# Patient Record
Sex: Female | Born: 1970 | Race: White | Hispanic: No | Marital: Married | Smoking: Never smoker
Health system: Southern US, Community
[De-identification: ages and names within clinical notes are randomized; demographics above are authoritative.]

## PROBLEM LIST (undated history)

## (undated) DIAGNOSIS — F32A Depression, unspecified: Secondary | ICD-10-CM

## (undated) DIAGNOSIS — F419 Anxiety disorder, unspecified: Secondary | ICD-10-CM

## (undated) DIAGNOSIS — Z789 Other specified health status: Secondary | ICD-10-CM

## (undated) DIAGNOSIS — D649 Anemia, unspecified: Secondary | ICD-10-CM

## (undated) HISTORY — DX: Other specified health status: Z78.9

## (undated) HISTORY — PX: BREAST ENHANCEMENT SURGERY: SHX7

## (undated) HISTORY — PX: AUGMENTATION MAMMAPLASTY: SUR837

## (undated) HISTORY — PX: COLONOSCOPY: SHX174

---

## 2020-02-15 ENCOUNTER — Emergency Department (HOSPITAL_BASED_OUTPATIENT_CLINIC_OR_DEPARTMENT_OTHER)
Admission: EM | Admit: 2020-02-15 | Discharge: 2020-02-15 | Disposition: A | Discharge status: Home / Self Care | Payer: 59 | Attending: Emergency Medicine | Admitting: Emergency Medicine

## 2020-02-15 ENCOUNTER — Other Ambulatory Visit: Payer: Self-pay

## 2020-02-15 ENCOUNTER — Emergency Department (HOSPITAL_BASED_OUTPATIENT_CLINIC_OR_DEPARTMENT_OTHER): Payer: 59

## 2020-02-15 ENCOUNTER — Encounter (HOSPITAL_BASED_OUTPATIENT_CLINIC_OR_DEPARTMENT_OTHER): Payer: Self-pay

## 2020-02-15 DIAGNOSIS — S3992XA Unspecified injury of lower back, initial encounter: Secondary | ICD-10-CM | POA: Diagnosis present

## 2020-02-15 DIAGNOSIS — Y9389 Activity, other specified: Secondary | ICD-10-CM | POA: Diagnosis not present

## 2020-02-15 DIAGNOSIS — S32008A Other fracture of unspecified lumbar vertebra, initial encounter for closed fracture: Secondary | ICD-10-CM | POA: Diagnosis not present

## 2020-02-15 DIAGNOSIS — Y92410 Unspecified street and highway as the place of occurrence of the external cause: Secondary | ICD-10-CM | POA: Insufficient documentation

## 2020-02-15 DIAGNOSIS — Y999 Unspecified external cause status: Secondary | ICD-10-CM | POA: Insufficient documentation

## 2020-02-15 DIAGNOSIS — S7001XA Contusion of right hip, initial encounter: Secondary | ICD-10-CM | POA: Diagnosis not present

## 2020-02-15 DIAGNOSIS — S32009A Unspecified fracture of unspecified lumbar vertebra, initial encounter for closed fracture: Secondary | ICD-10-CM

## 2020-02-15 MED ORDER — HYDROCODONE-ACETAMINOPHEN 5-325 MG PO TABS: 1.0000 | ORAL_TABLET | Freq: Four times a day (QID) | ORAL | 0 refills | Status: DC | PRN
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
## 2020-02-15 NOTE — Discharge Instructions (Addendum)
Take ibuprofen 600 mg every 6 hours as needed for pain.  Begin taking hydrocodone as prescribed as needed for pain not relieved with ibuprofen.  Rest and no strenuous activity for the next month.  Follow-up with your primary doctor if not improving in the next 2 weeks.  Return to the ER if symptoms significantly worsen or change.
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
## 2020-02-24 ENCOUNTER — Other Ambulatory Visit: Payer: Self-pay | Admitting: Orthopedic Surgery

## 2020-02-24 DIAGNOSIS — M259 Joint disorder, unspecified: Secondary | ICD-10-CM

## 2020-03-10 ENCOUNTER — Other Ambulatory Visit: Payer: Self-pay

## 2020-03-10 ENCOUNTER — Ambulatory Visit
Admission: RE | Admit: 2020-03-10 | Discharge: 2020-03-10 | Disposition: A | Discharge status: Home / Self Care | Payer: 59 | Source: Ambulatory Visit | Attending: Orthopedic Surgery | Admitting: Orthopedic Surgery

## 2020-03-10 DIAGNOSIS — M259 Joint disorder, unspecified: Secondary | ICD-10-CM

## 2020-03-18 ENCOUNTER — Other Ambulatory Visit: Payer: Self-pay

## 2020-03-18 ENCOUNTER — Ambulatory Visit (INDEPENDENT_AMBULATORY_CARE_PROVIDER_SITE_OTHER): Payer: 59 | Admitting: Neurology

## 2020-03-18 ENCOUNTER — Encounter: Payer: Self-pay | Admitting: Neurology

## 2020-03-18 VITALS — BP 110/71 | HR 67 | Ht 63.0 in | Wt 138.0 lb

## 2020-03-18 DIAGNOSIS — R42 Dizziness and giddiness: Secondary | ICD-10-CM

## 2020-03-18 DIAGNOSIS — F0781 Postconcussional syndrome: Secondary | ICD-10-CM | POA: Diagnosis not present

## 2020-03-18 MED ORDER — AMITRIPTYLINE HCL 10 MG PO TABS: 10.0000 mg | ORAL_TABLET | Freq: Every day | ORAL | 2 refills | Status: DC
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
## 2020-03-18 NOTE — Patient Instructions (Signed)
Amitriptyline at bedtime   Returning to Sports and Activities After a Concussion, Adult Knowing when to return to sports and activities after a concussion is important. Make sure you wait to return to activity until after both of these have occurred:  Your symptoms are completely gone.  A health care provider says it is safe. Returning to activity too soon increases the risk of serious and long-lasting symptoms. You may also need to take time away from work or other activities that require concentration, depending on how severe your concussion is. Concussions can have serious effects on your brain. People who have more than one concussion are at greater risk of having long-term (chronic) headaches and problems with learning. When can I return to sports or other activities? You should stop participating in an activity right away after you hit your head or a concussion is suspected. You need to rest physically and mentally. You should also be monitored carefully by another adult. How quickly you can return to sports and other activities depends on:  Your age.  The severity of your concussion.  Your health before the injury.  Whether you have had a previous concussion. How should I gradually return to sports or other activities?  You should not return to sports or activities until you are symptom-free without medicine for at least 24 hours. Your health care provider will determine when your symptoms are completely gone and when it is safe for you to practice and play sports again. Make sure you return to sports gradually. Do not try to do too much too soon. Gradually advance through the following activity levels to return to sports: 1. Begin with only light aerobic activity to increase your heart rate. You may bike, walk, or jog for up to 10 minutes. Do not jump, run, or lift weights. 2. Get moderate physical activity with some head and body movements. Running short distances, fast jogging,  using a stationary bike, and moderate-intensity weight lifting are okay. 3. Participate in high-intensity exercise without physical contact. 4. Return to your normal practice routine, which may include full contact. 5. Return to play in games, matches, or other competitions. Some people can progress quickly through these levels. Other people will need several days to go from one level to the next. Do not move on to the next level until you have been symptom-free for at least 24 hours after doing activity at the previous level. Symptoms to watch for include:  Tiredness (fatigue).  Headache.  Problems with balance, coordination, or memory. If you notice any of these warning signs during exercise or other physical activities, rest for at least 24 hours or until the symptoms go away. You can then return to activity. Start at the activity level that you were on before your symptoms began. What symptoms are important to report to my health care provider? Concussion symptoms may not appear right away. They could also get worse at any time. It is important to let your health care provider know if you have any new or worsening symptoms. Watch for these symptoms: Physical symptoms  Headaches.  Dizziness and problems with coordination or balance.  Sensitivity to light or noise.  Nausea or vomiting.  Tiredness (fatigue).  Vision or hearing problems.  Seizure. Mental and emotional symptoms  Irritability or mood changes.  Memory problems.  Trouble concentrating, organizing, or making decisions.  Changes in eating or sleeping patterns.  Slowness in thinking, acting or reacting, speaking, or reading.  Anxiety or depression. Certain health  issues may make your recovery from a concussion take longer. Let your health care provider know if you have a history of:  Migraines.  Depression.  Mood disorders.  Anxiety.  A developmental disorder, such as ADHD.  Any previous  concussions. What are some questions to ask my health care provider? When you have a concussion, learning as much as you can about your injury can help you protect your long-term health. Ask your health care provider the following questions: Questions about recovery and treatment  Is it safe for me to return to sports or other physical activities?  Should I limit how much time I watch TV, play video games, or use a computer?  Do I need to take time away from work?  Do I need more sleep than normal? Questions about the effects of a concussion  What are the short-term and long-term consequences of my injury?  How might the concussion affect my professional life?  Could I have problems with memory or learning? Questions about getting another concussion  When should I go to the emergency room?  What happens if I get another concussion?  What are the warning signs of another concussion?  Could I have a concussion without knowing it?  How can I prevent another concussion from happening?  Should I consider not playing sports anymore? Where to find more information  Centers for Disease Control and Prevention: [employer]Ex[employer].com.brwww.cdc.gov Summary  Returning to activity too soon after a concussion increases the risk of serious and long-lasting symptoms.  After a concussion, you must rest physically and mentally until your symptoms are gone. Your health care provider will determine when it is safe for you to return to sports, work, and other activities that require concentration.  Returning to activity is a slow process, starting with limited activity and monitoring for symptoms.  If symptoms do not occur with activity, the intensity of activity can be increased.  Make sure you talk with your health care provider before returning to activity. This information is not intended to replace advice given to you by your health care provider. Make sure you discuss any questions you have with your health  care provider. Document Revised: 05/15/2019 Document Reviewed: 05/15/2019 Elsevier Patient Education  2020 Elsevier Inc.  Post-Concussion Syndrome  Post-concussion syndrome is when symptoms last longer than normal after a head injury. What are the signs or symptoms? After a head injury, you may:  Have headaches.  Feel tired.  Feel dizzy.  Feel weak.  Have trouble seeing.  Have trouble in bright lights.  Have trouble hearing.  Not be able to remember things.  Not be able to focus.  Have trouble sleeping.  Have mood swings.  Have trouble learning new things. These can last from weeks to months. Follow these instructions at home: Medicines  Take all medicines only as told by your doctor.  Do not take prescription pain medicines. Activity  Limit activities as told by your doctor. This includes: ? Homework. ? Job-related work. ? Thinking. ? Watching TV. ? Using a computer or phone. ? Puzzles. ? Exercise. ? Sports.  Slowly return to your normal activity as told by your doctor.  Stop an activity if you have symptoms.  Do not do anything that may cause you to get injured again. General instructions  Rest. Try to: ? Sleep 7-9 hours each night. ? Take naps or breaks when you feel tired during the day.  Do not drink alcohol until your doctor says that you can.  Keep track of your symptoms.  Keep all follow-up visits as told by your doctor. This is important. Contact a doctor if:  You do not improve.  You get worse.  You have another injury. Get help right away if:  You have a very bad headache.  You feel confused.  You feel very sleepy.  You pass out (faint).  You throw up (vomit).  You feel weak in any part of your body.  You feel numb in any part of your body.  You start shaking (have a seizure).  You have trouble talking. Summary  Post-concussion syndrome is when symptoms last longer than normal after a head injury.  Limit all  activity after your injury. Gradually return to normal activity as told by your doctor.  Rest, do not drink alcohol, and avoid prescription pain medicines after a concussion.  Call your doctor if your symptoms get worse. This information is not intended to replace advice given to you by your health care provider. Make sure you discuss any questions you have with your health care provider. Document Revised: 07/26/2018 Document Reviewed: 11/07/2017 Elsevier Patient Education  2020 Elsevier Inc.  Amitriptyline tablets What is this medicine? AMITRIPTYLINE (a mee TRIP ti leen) is used to treat depression. This medicine may be used for other purposes; ask your health care provider or pharmacist if you have questions. COMMON BRAND NAME(S): Elavil, Vanatrip What should I tell my health care provider before I take this medicine? They need to know if you have any of these conditions:  an alcohol problem  asthma, difficulty breathing  bipolar disorder or schizophrenia  difficulty passing urine, prostate trouble  glaucoma  heart disease or previous heart attack  liver disease  over active thyroid  seizures  thoughts or plans of suicide, a previous suicide attempt, or family history of suicide attempt  an unusual or allergic reaction to amitriptyline, other medicines, foods, dyes, or preservatives  pregnant or trying to get pregnant  breast-feeding How should I use this medicine? Take this medicine by mouth with a drink of water. Follow the directions on the prescription label. You can take the tablets with or without food. Take your medicine at regular intervals. Do not take it more often than directed. Do not stop taking this medicine suddenly except upon the advice of your doctor. Stopping this medicine too quickly may cause serious side effects or your condition may worsen. A special MedGuide will be given to you by the pharmacist with each prescription and refill. Be sure to read  this information carefully each time. Talk to your pediatrician regarding the use of this medicine in children. Special care may be needed. Overdosage: If you think you have taken too much of this medicine contact a poison control center or emergency room at once. NOTE: This medicine is only for you. Do not share this medicine with others. What if I miss a dose? If you miss a dose, take it as soon as you can. If it is almost time for your next dose, take only that dose. Do not take double or extra doses. What may interact with this medicine? Do not take this medicine with any of the following medications:  arsenic trioxide  certain medicines used to regulate abnormal heartbeat or to treat other heart conditions  cisapride  droperidol  halofantrine  linezolid  MAOIs like Carbex, Eldepryl, Marplan, Nardil, and Parnate  methylene blue  other medicines for mental depression  phenothiazines like perphenazine, thioridazine and chlorpromazine  pimozide  probucol  procarbazine  sparfloxacin  St. John's Wort This medicine may also interact with the following medications:  atropine and related drugs like hyoscyamine, scopolamine, tolterodine and others  barbiturate medicines for inducing sleep or treating seizures, like phenobarbital  cimetidine  disulfiram  ethchlorvynol  thyroid hormones such as levothyroxine  ziprasidone This list may not describe all possible interactions. Give your health care provider a list of all the medicines, herbs, non-prescription drugs, or dietary supplements you use. Also tell them if you smoke, drink alcohol, or use illegal drugs. Some items may interact with your medicine. What should I watch for while using this medicine? Tell your doctor if your symptoms do not get better or if they get worse. Visit your doctor or health care professional for regular checks on your progress. Because it may take several weeks to see the full effects of  this medicine, it is important to continue your treatment as prescribed by your doctor. Patients and their families should watch out for new or worsening thoughts of suicide or depression. Also watch out for sudden changes in feelings such as feeling anxious, agitated, panicky, irritable, hostile, aggressive, impulsive, severely restless, overly excited and hyperactive, or not being able to sleep. If this happens, especially at the beginning of treatment or after a change in dose, call your health care professional. Bonita Quin may get drowsy or dizzy. Do not drive, use machinery, or do anything that needs mental alertness until you know how this medicine affects you. Do not stand or sit up quickly, especially if you are an older patient. This reduces the risk of dizzy or fainting spells. Alcohol may interfere with the effect of this medicine. Avoid alcoholic drinks. Do not treat yourself for coughs, colds, or allergies without asking your doctor or health care professional for advice. Some ingredients can increase possible side effects. Your mouth may get dry. Chewing sugarless gum or sucking hard candy, and drinking plenty of water will help. Contact your doctor if the problem does not go away or is severe. This medicine may cause dry eyes and blurred vision. If you wear contact lenses you may feel some discomfort. Lubricating drops may help. See your eye doctor if the problem does not go away or is severe. This medicine can cause constipation. Try to have a bowel movement at least every 2 to 3 days. If you do not have a bowel movement for 3 days, call your doctor or health care professional. This medicine can make you more sensitive to the sun. Keep out of the sun. If you cannot avoid being in the sun, wear protective clothing and use sunscreen. Do not use sun lamps or tanning beds/booths. What side effects may I notice from receiving this medicine? Side effects that you should report to your doctor or health  care professional as soon as possible:  allergic reactions like skin rash, itching or hives, swelling of the face, lips, or tongue  anxious  breathing problems  changes in vision  confusion  elevated mood, decreased need for sleep, racing thoughts, impulsive behavior  eye pain  fast, irregular heartbeat  feeling faint or lightheaded, falls  feeling agitated, angry, or irritable  fever with increased sweating  hallucination, loss of contact with reality  seizures  stiff muscles  suicidal thoughts or other mood changes  tingling, pain, or numbness in the feet or hands  trouble passing urine or change in the amount of urine  trouble sleeping  unusually weak or tired  vomiting  yellowing of the eyes or skin Side effects that usually do not require medical attention (report to your doctor or health care professional if they continue or are bothersome):  change in sex drive or performance  change in appetite or weight  constipation  dizziness  dry mouth  nausea  tired  tremors  upset stomach This list may not describe all possible side effects. Call your doctor for medical advice about side effects. You may report side effects to FDA at 1-800-FDA-1088. Where should I keep my medicine? Keep out of the reach of children. Store at room temperature between 20 and 25 degrees C (68 and 77 degrees F). Throw away any unused medicine after the expiration date. NOTE: This sheet is a summary. It may not cover all possible information. If you have questions about this medicine, talk to your doctor, pharmacist, or health care provider.  2020 Elsevier/Gold Standard (2018-09-25 13:04:32)

## 2020-04-14 ENCOUNTER — Other Ambulatory Visit: Payer: Self-pay | Admitting: Family Medicine

## 2020-04-14 DIAGNOSIS — Z1231 Encounter for screening mammogram for malignant neoplasm of breast: Secondary | ICD-10-CM

## 2020-04-24 ENCOUNTER — Ambulatory Visit
Admission: RE | Admit: 2020-04-24 | Discharge: 2020-04-24 | Disposition: A | Discharge status: Home / Self Care | Payer: 59 | Source: Ambulatory Visit | Attending: Family Medicine | Admitting: Family Medicine

## 2020-04-24 ENCOUNTER — Other Ambulatory Visit: Payer: Self-pay

## 2020-04-24 ENCOUNTER — Other Ambulatory Visit: Payer: Self-pay | Admitting: Family Medicine

## 2020-04-24 DIAGNOSIS — Z1231 Encounter for screening mammogram for malignant neoplasm of breast: Secondary | ICD-10-CM

## 2020-05-15 ENCOUNTER — Other Ambulatory Visit: Payer: Self-pay | Admitting: Neurology

## 2020-07-02 ENCOUNTER — Other Ambulatory Visit: Payer: Self-pay | Admitting: Orthopedic Surgery

## 2020-07-02 DIAGNOSIS — M259 Joint disorder, unspecified: Secondary | ICD-10-CM

## 2020-07-16 ENCOUNTER — Other Ambulatory Visit: Payer: Self-pay

## 2020-07-16 ENCOUNTER — Ambulatory Visit
Admission: RE | Admit: 2020-07-16 | Discharge: 2020-07-16 | Disposition: A | Discharge status: Home / Self Care | Payer: 59 | Source: Ambulatory Visit | Attending: Orthopedic Surgery | Admitting: Orthopedic Surgery

## 2020-07-16 DIAGNOSIS — M259 Joint disorder, unspecified: Secondary | ICD-10-CM

## 2020-07-16 MED ORDER — METHYLPREDNISOLONE ACETATE 40 MG/ML INJ SUSP (RADIOLOG: 120.0000 mg | Freq: Once | INTRAMUSCULAR | Status: DC

## 2020-10-26 IMAGING — MG DIGITAL SCREENING BREAST BILAT IMPLANT W/ TOMO W/ CAD
9 of 12 series · 9 of 28 positions shown · non-contrast
Comparison: None.

CLINICAL DATA: Screening.

EXAM:
DIGITAL SCREENING BILATERAL MAMMOGRAM WITH IMPLANTS, CAD AND TOMO
The patient has bilateral retroglandular silicone implants. Standard
and implant displaced views were performed.

[R MLO]
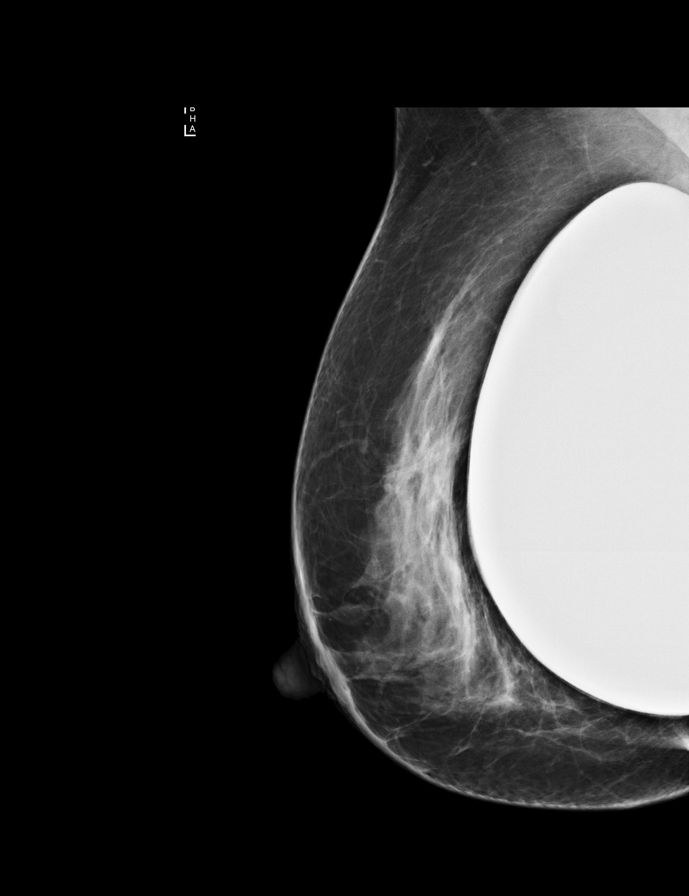

[L MLO]
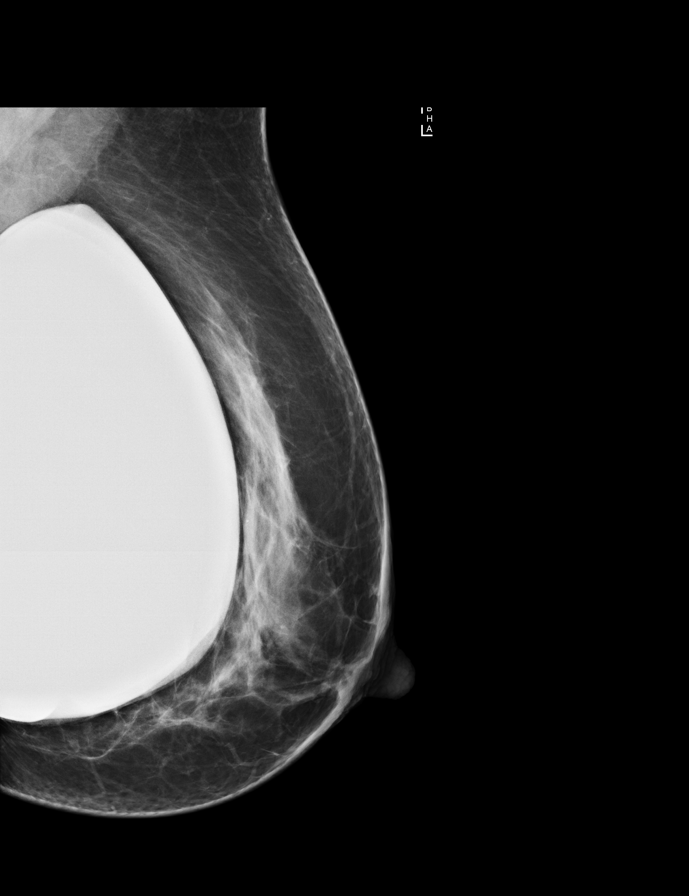

[R CC]
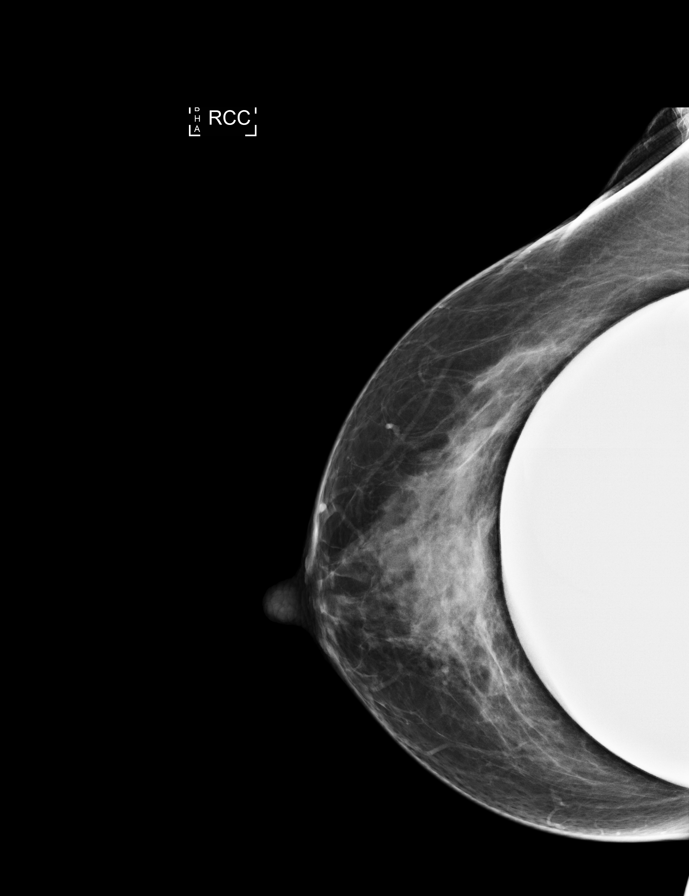

[L CC]
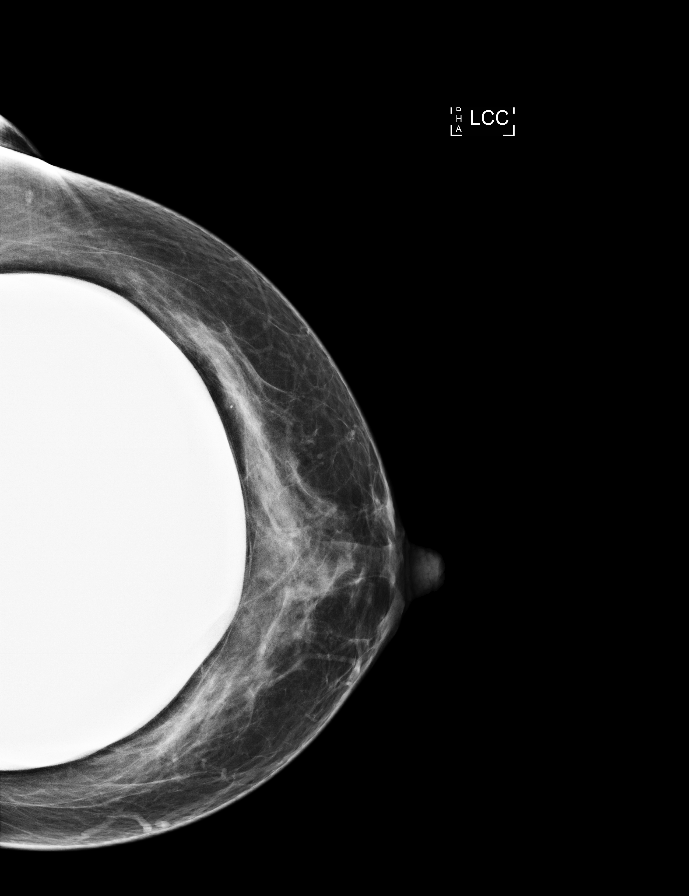

[L CC synth-2D]
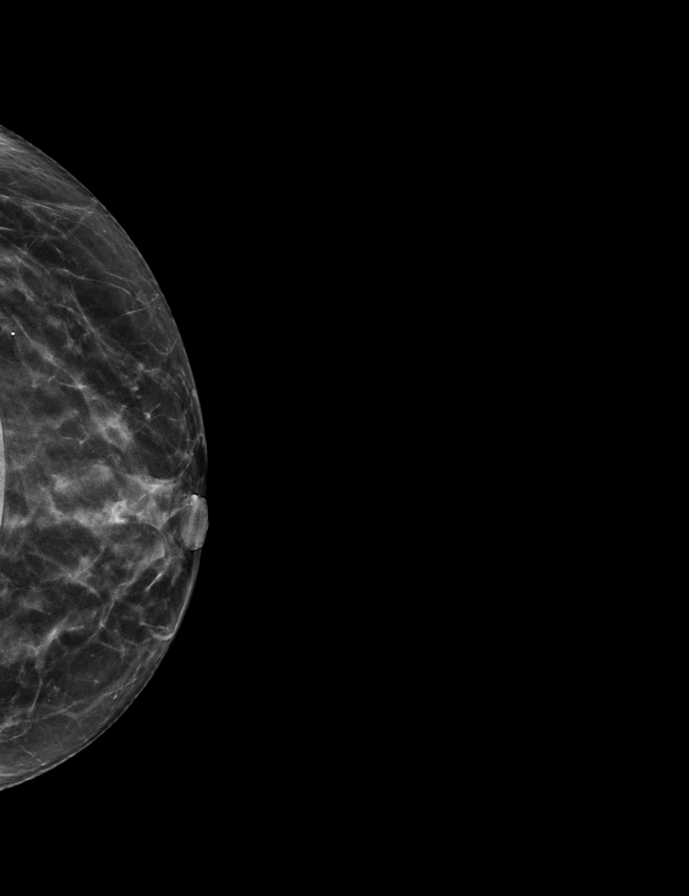

[R MLO synth-2D]
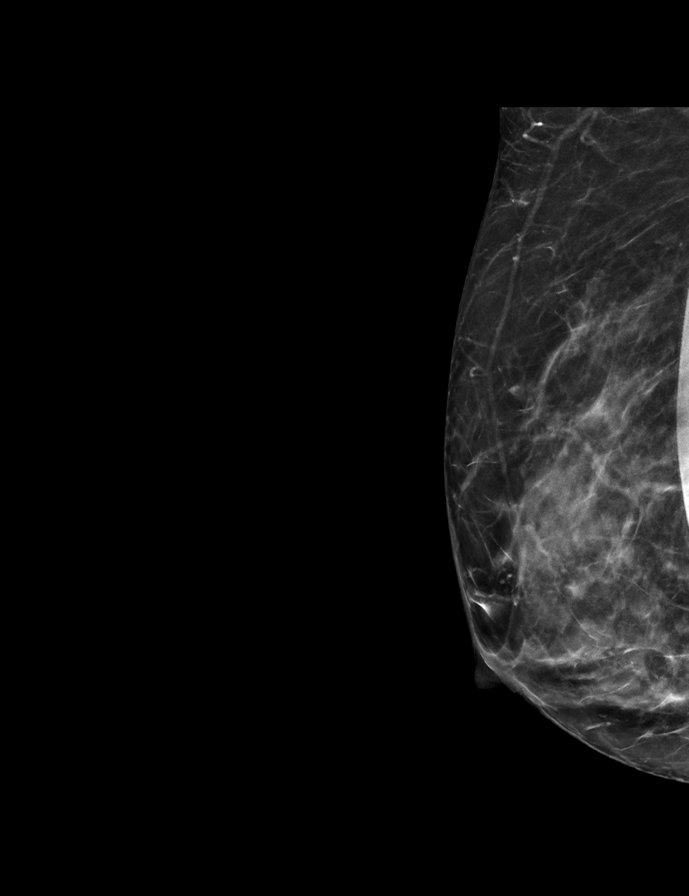

[L MLO synth-2D]
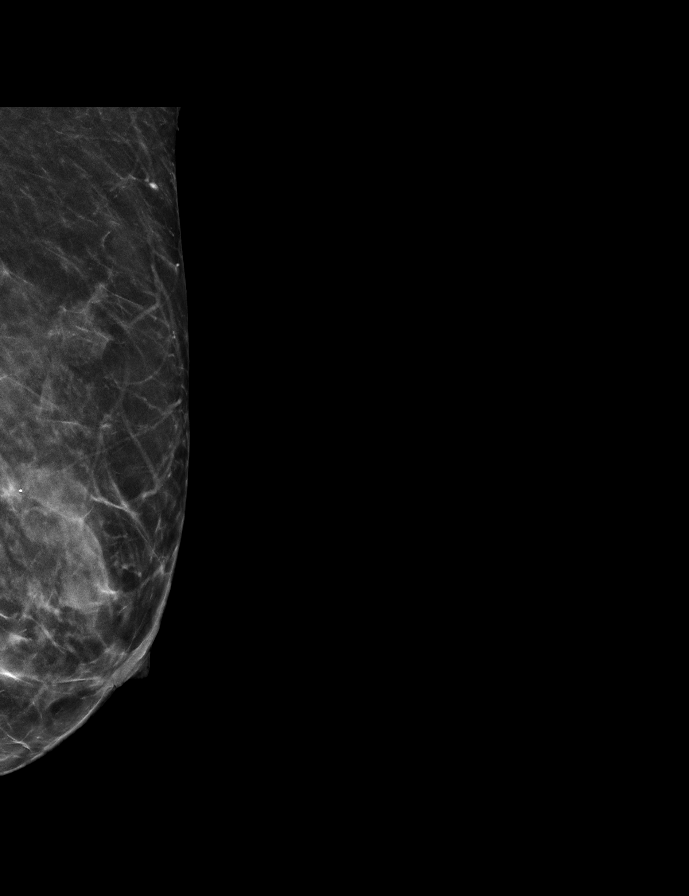

[R CC synth-2D]
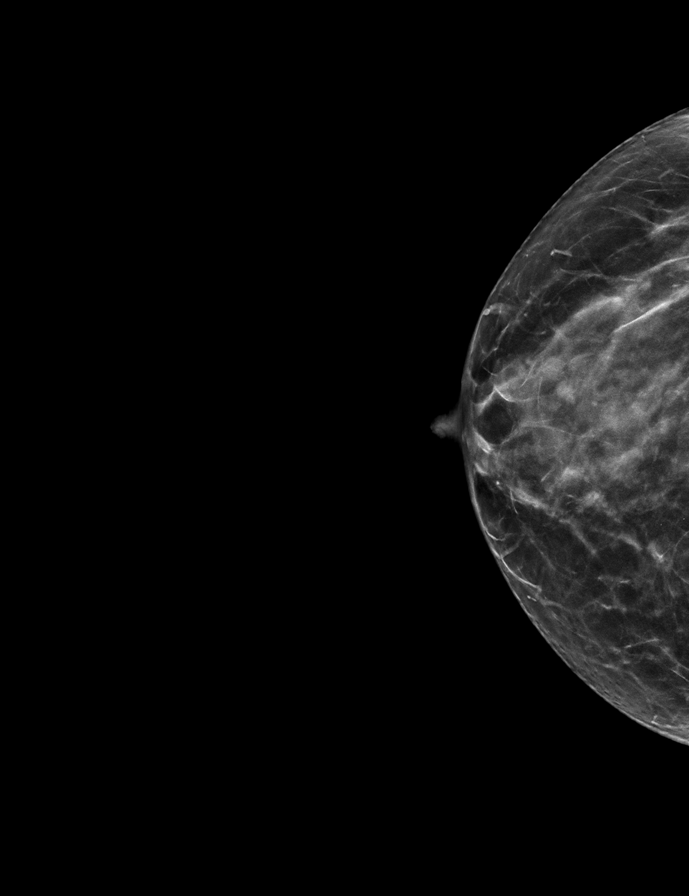

[L MLOID BREAST TOMOSYNTHESIS IMAGE tomo · tomo slice 27/53.0]
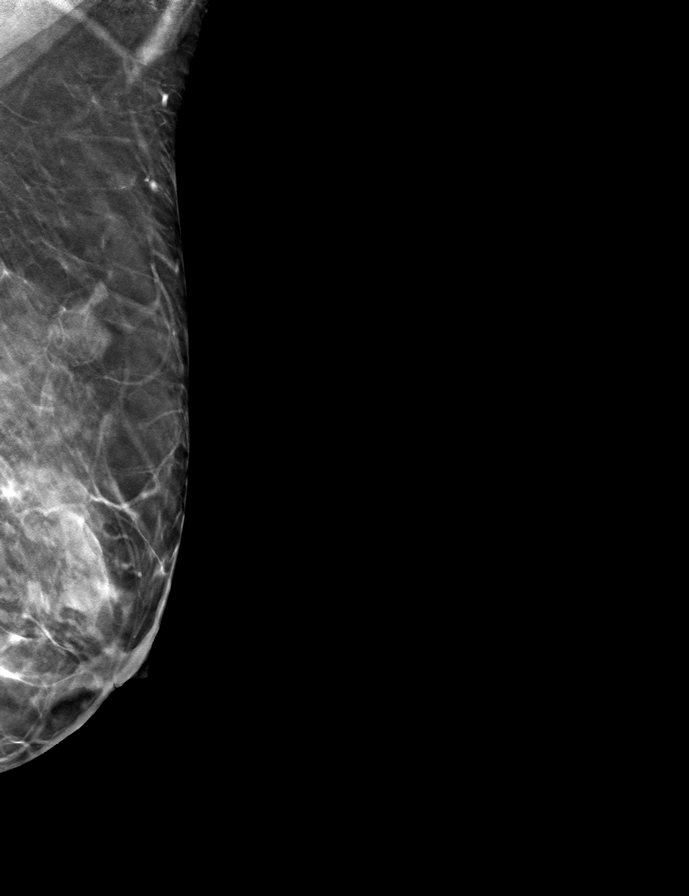

[9 of 28 positions shown; findings below may reference images not displayed]

ACR Breast Density Category c: The breast tissue is heterogeneously
dense, which may obscure small masses.
FINDINGS: There are no findings suspicious for malignancy. Images were
processed with CAD.
IMPRESSION: No mammographic evidence of malignancy. A result letter of this
screening mammogram will be mailed directly to the patient.

RECOMMENDATION:
Screening mammogram in one year. (Code:SA-[id number])

BI-RADS CATEGORY  1:  Negative.

## 2020-11-10 DIAGNOSIS — M533 Sacrococcygeal disorders, not elsewhere classified: Secondary | ICD-10-CM | POA: Diagnosis not present

## 2020-11-25 DIAGNOSIS — D649 Anemia, unspecified: Secondary | ICD-10-CM | POA: Diagnosis not present

## 2020-11-25 DIAGNOSIS — Z01818 Encounter for other preprocedural examination: Secondary | ICD-10-CM | POA: Diagnosis not present

## 2021-01-25 DIAGNOSIS — K5730 Diverticulosis of large intestine without perforation or abscess without bleeding: Secondary | ICD-10-CM | POA: Diagnosis not present

## 2021-01-25 DIAGNOSIS — Z1211 Encounter for screening for malignant neoplasm of colon: Secondary | ICD-10-CM | POA: Diagnosis not present

## 2021-01-25 DIAGNOSIS — Z800 Family history of malignant neoplasm of digestive organs: Secondary | ICD-10-CM | POA: Diagnosis not present

## 2021-01-25 DIAGNOSIS — D123 Benign neoplasm of transverse colon: Secondary | ICD-10-CM | POA: Diagnosis not present

## 2021-01-25 DIAGNOSIS — K648 Other hemorrhoids: Secondary | ICD-10-CM | POA: Diagnosis not present

## 2021-01-25 DIAGNOSIS — K621 Rectal polyp: Secondary | ICD-10-CM | POA: Diagnosis not present

## 2021-03-01 DIAGNOSIS — M533 Sacrococcygeal disorders, not elsewhere classified: Secondary | ICD-10-CM | POA: Diagnosis not present

## 2021-03-03 NOTE — Progress Notes (Signed)
Surgical Instructions    Your procedure is scheduled on Thursday May 26th.  Report to [facility] Main Entrance "A" at 9 A.M., then check in with the Admitting office.  Call this number if you have problems the morning of surgery:  [phone]   If you have any questions prior to your surgery date call [phone]: Open Monday-Friday 8am-4pm    Remember:  Do not eat after midnight the night before your surgery  You may drink clear liquids until 8am the morning of your surgery.   Clear liquids allowed are: Water, Non-Citrus Juices (without pulp), Carbonated Beverages, Clear Tea, Black Coffee Only, and Gatorade    Take these medicines the morning of surgery with A SIP OF WATER   escitalopram (LEXAPRO) 20 MG tablet  DULoxetine (CYMBALTA) 30 MG capsule if needed    As of today, STOP taking any Aspirin (unless otherwise instructed by your surgeon) Aleve, Naproxen, Ibuprofen, Motrin, Advil, Goody's, BC's, all herbal medications, fish oil, and all vitamins.                     Do not wear jewelry, make up, or nail polish            Do not wear lotions, powders, perfumes, or deodorant.            Do not shave 48 hours prior to surgery.             Do not bring valuables to the hospital.            [facility] is not responsible for any belongings or valuables.  Do NOT Smoke (Tobacco/Vaping) or drink Alcohol 24 hours prior to your procedure If you use a CPAP at night, you may bring all equipment for your overnight stay.   Contacts, glasses, dentures or bridgework may not be worn into surgery, please bring cases for these belongings   For patients admitted to the hospital, discharge time will be determined by your treatment team.   Patients discharged the day of surgery will not be allowed to drive home, and someone needs to stay with them for 24 hours.    Special instructions:   [place]- Preparing For Surgery  Before surgery, you can play an important role. Because skin is not  sterile, your skin needs to be as free of germs as possible. You can reduce the number of germs on your skin by washing with CHG (chlorahexidine gluconate) Soap before surgery.  CHG is an antiseptic cleaner which kills germs and bonds with the skin to continue killing germs even after washing.    Oral Hygiene is also important to reduce your risk of infection.  Remember - BRUSH YOUR TEETH THE MORNING OF SURGERY WITH YOUR REGULAR TOOTHPASTE  Please do not use if you have an allergy to CHG or antibacterial soaps. If your skin becomes reddened/irritated stop using the CHG.  Do not shave (including legs and underarms) for at least 48 hours prior to first CHG shower. It is OK to shave your face.  Please follow these instructions carefully.   1. Shower the NIGHT BEFORE SURGERY and the MORNING OF SURGERY  2. If you chose to wash your hair, wash your hair first as usual with your normal shampoo.  3. After you shampoo, rinse your hair and body thoroughly to remove the shampoo.  4. Wash Face and genitals (private parts) with your normal soap.   5.  Shower the [employer] SURGERY and the  MORNING OF SURGERY with CHG Soap.   6. Use CHG Soap as you would any other liquid soap. You can apply CHG directly to the skin and wash gently with a scrungie or a clean washcloth.   7. Apply the CHG Soap to your body ONLY FROM THE NECK DOWN.  Do not use on open wounds or open sores. Avoid contact with your eyes, ears, mouth and genitals (private parts). Wash Face and genitals (private parts)  with your normal soap.   8. Wash thoroughly, paying special attention to the area where your surgery will be performed.  9. Thoroughly rinse your body with warm water from the neck down.  10. DO NOT shower/wash with your normal soap after using and rinsing off the CHG Soap.  11. Pat yourself dry with a CLEAN TOWEL.  12. Wear CLEAN PAJAMAS to bed the night before surgery  13. Place CLEAN SHEETS on your bed the night  before your surgery  14. DO NOT SLEEP WITH PETS.   Day of Surgery: Take a shower with CHG soap. Wear Clean/Comfortable clothing the morning of surgery Do not apply any deodorants/lotions.   Remember to brush your teeth WITH YOUR REGULAR TOOTHPASTE.   Please read over the following fact sheets that you were given.

## 2021-03-04 ENCOUNTER — Other Ambulatory Visit: Payer: Self-pay

## 2021-03-04 ENCOUNTER — Ambulatory Visit (HOSPITAL_COMMUNITY): Payer: Self-pay | Admitting: Orthopedic Surgery

## 2021-03-04 ENCOUNTER — Encounter (HOSPITAL_COMMUNITY)
Admission: RE | Admit: 2021-03-04 | Discharge: 2021-03-04 | Disposition: A | Discharge status: Home / Self Care | Payer: BC Managed Care – PPO | Source: Ambulatory Visit | Attending: Orthopedic Surgery | Admitting: Orthopedic Surgery

## 2021-03-04 ENCOUNTER — Encounter (HOSPITAL_COMMUNITY): Payer: Self-pay

## 2021-03-04 DIAGNOSIS — Z01812 Encounter for preprocedural laboratory examination: Secondary | ICD-10-CM | POA: Insufficient documentation

## 2021-03-04 HISTORY — DX: Anemia, unspecified: D64.9

## 2021-03-04 HISTORY — DX: Depression, unspecified: F32.A

## 2021-03-04 HISTORY — DX: Anxiety disorder, unspecified: F41.9

## 2021-03-04 LAB — CBC
HCT: 38.3 % (ref 36.0–46.0)
Hemoglobin: 12.8 g/dL (ref 12.0–15.0)
MCH: 31.6 pg (ref 26.0–34.0)
MCHC: 33.4 g/dL (ref 30.0–36.0)
MCV: 94.6 fL (ref 80.0–100.0)
Platelets: 239 10*3/uL (ref 150–400)
RBC: 4.05 MIL/uL (ref 3.87–5.11)
RDW: 11.7 % (ref 11.5–15.5)
WBC: 5.8 10*3/uL (ref 4.0–10.5)
nRBC: 0 % (ref 0.0–0.2)

## 2021-03-04 LAB — SURGICAL PCR SCREEN
MRSA, PCR: NEGATIVE
Staphylococcus aureus: NEGATIVE

## 2021-03-04 NOTE — Progress Notes (Signed)
PCP - Dr. Abigail Miyamoto Cardiologist - patient denies  PPM/ICD - n/a Device Orders -  Rep Notified -   Chest x-ray - n/a EKG - n/a Stress Test - patient denies ECHO - patient denies Cardiac Cath - patient denies  Sleep Study - patient denies CPAP -   Fasting Blood Sugar - n/a Checks Blood Sugar _____ times a day  Blood Thinner Instructions: n/a Aspirin Instructions: n/a   ERAS Protcol -clears until 0800am DOS PRE-SURGERY Ensure or G2-   COVID TEST- ambulatory surgery   Anesthesia review: n/a  Patient denies shortness of breath, fever, cough and chest pain at PAT appointment   All instructions explained to the patient, with a verbal understanding of the material. Patient agrees to go over the instructions while at home for a better understanding. Patient also instructed to self quarantine after being tested for COVID-19. The opportunity to ask questions was provided.

## 2021-03-08 ENCOUNTER — Ambulatory Visit: Payer: Self-pay | Admitting: Orthopedic Surgery
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
## 2021-03-08 NOTE — H&P (Deleted)
  The note originally documented on this encounter has been moved the the encounter in which it belongs.  

## 2021-03-11 ENCOUNTER — Ambulatory Visit (HOSPITAL_COMMUNITY)
Admission: RE | Admit: 2021-03-11 | Discharge: 2021-03-11 | Disposition: A | Discharge status: Home / Self Care | Payer: BC Managed Care – PPO | Attending: Orthopedic Surgery | Admitting: Orthopedic Surgery

## 2021-03-11 ENCOUNTER — Ambulatory Visit (HOSPITAL_COMMUNITY): Payer: BC Managed Care – PPO

## 2021-03-11 ENCOUNTER — Encounter (HOSPITAL_COMMUNITY): Payer: Self-pay | Admitting: Orthopedic Surgery

## 2021-03-11 ENCOUNTER — Ambulatory Visit (HOSPITAL_COMMUNITY): Payer: BC Managed Care – PPO | Admitting: Anesthesiology

## 2021-03-11 ENCOUNTER — Other Ambulatory Visit: Payer: Self-pay

## 2021-03-11 ENCOUNTER — Encounter (HOSPITAL_COMMUNITY)
Admission: RE | Disposition: A | Discharge status: Home / Self Care | Payer: Self-pay | Source: Home / Self Care | Attending: Orthopedic Surgery

## 2021-03-11 DIAGNOSIS — M533 Sacrococcygeal disorders, not elsewhere classified: Secondary | ICD-10-CM | POA: Insufficient documentation

## 2021-03-11 DIAGNOSIS — Z79899 Other long term (current) drug therapy: Secondary | ICD-10-CM | POA: Diagnosis not present

## 2021-03-11 DIAGNOSIS — D649 Anemia, unspecified: Secondary | ICD-10-CM | POA: Diagnosis not present

## 2021-03-11 DIAGNOSIS — F418 Other specified anxiety disorders: Secondary | ICD-10-CM | POA: Diagnosis not present

## 2021-03-11 DIAGNOSIS — M9904 Segmental and somatic dysfunction of sacral region: Secondary | ICD-10-CM | POA: Diagnosis not present

## 2021-03-11 DIAGNOSIS — Z419 Encounter for procedure for purposes other than remedying health state, unspecified: Secondary | ICD-10-CM

## 2021-03-11 HISTORY — PX: SACROILIAC JOINT FUSION: SHX6088

## 2021-03-11 LAB — POCT PREGNANCY, URINE: Preg Test, Ur: NEGATIVE

## 2021-03-11 SURGERY — SACROILIAC JOINT FUSION
Anesthesia: General | Laterality: Right

## 2021-03-11 MED ORDER — BUPIVACAINE-EPINEPHRINE 0.25% -1:200000 IJ SOLN
INTRAMUSCULAR | Status: DC | PRN
  Administered 2021-03-11: 20 mL

## 2021-03-11 MED ORDER — OXYCODONE-ACETAMINOPHEN 10-325 MG PO TABS: 1.0000 | ORAL_TABLET | Freq: Four times a day (QID) | ORAL | 0 refills | Status: AC | PRN

## 2021-03-11 MED ORDER — PHENYLEPHRINE 40 MCG/ML (10ML) SYRINGE FOR IV PUSH (FOR BLOOD PRESSURE SUPPORT)
PREFILLED_SYRINGE | INTRAVENOUS | Status: DC | PRN
  Administered 2021-03-11: 80 ug via INTRAVENOUS
  Administered 2021-03-11: 120 ug via INTRAVENOUS
  Administered 2021-03-11: 40 ug via INTRAVENOUS
  Administered 2021-03-11 (×2): 80 ug via INTRAVENOUS

## 2021-03-11 MED ORDER — CHLORHEXIDINE GLUCONATE 0.12 % MT SOLN
15.0000 mL | Freq: Once | OROMUCOSAL | Status: AC
  Administered 2021-03-11: 15 mL via OROMUCOSAL
  Filled 2021-03-11: qty 15

## 2021-03-11 MED ORDER — LACTATED RINGERS IV SOLN: INTRAVENOUS | Status: DC

## 2021-03-11 MED ORDER — CHLORHEXIDINE GLUCONATE 0.12 % MT SOLN: 15.0000 mL | Freq: Once | OROMUCOSAL | Status: DC

## 2021-03-11 MED ORDER — FENTANYL CITRATE (PF) 100 MCG/2ML IJ SOLN: 25.0000 ug | INTRAMUSCULAR | Status: DC | PRN

## 2021-03-11 MED ORDER — ONDANSETRON HCL 4 MG/2ML IJ SOLN
INTRAMUSCULAR | Status: DC | PRN
  Administered 2021-03-11: 4 mg via INTRAVENOUS

## 2021-03-11 MED ORDER — SUGAMMADEX SODIUM 200 MG/2ML IV SOLN
INTRAVENOUS | Status: DC | PRN
  Administered 2021-03-11: 120 mg via INTRAVENOUS

## 2021-03-11 MED ORDER — DEXAMETHASONE SODIUM PHOSPHATE 10 MG/ML IJ SOLN
INTRAMUSCULAR | Status: AC
  Filled 2021-03-11: qty 1

## 2021-03-11 MED ORDER — ONDANSETRON HCL 4 MG PO TABS: 4.0000 mg | ORAL_TABLET | Freq: Three times a day (TID) | ORAL | 0 refills | Status: AC | PRN

## 2021-03-11 MED ORDER — METHOCARBAMOL 500 MG PO TABS: 500.0000 mg | ORAL_TABLET | Freq: Three times a day (TID) | ORAL | 0 refills | Status: AC | PRN

## 2021-03-11 MED ORDER — ACETAMINOPHEN 10 MG/ML IV SOLN
INTRAVENOUS | Status: DC | PRN
  Administered 2021-03-11: 1000 mg via INTRAVENOUS

## 2021-03-11 MED ORDER — ACETAMINOPHEN 10 MG/ML IV SOLN
INTRAVENOUS | Status: AC
  Filled 2021-03-11: qty 100

## 2021-03-11 MED ORDER — CEFAZOLIN SODIUM-DEXTROSE 2-4 GM/100ML-% IV SOLN
2.0000 g | INTRAVENOUS | Status: AC
  Administered 2021-03-11: 2 g via INTRAVENOUS
  Filled 2021-03-11: qty 100

## 2021-03-11 MED ORDER — PROPOFOL 10 MG/ML IV BOLUS
INTRAVENOUS | Status: AC
  Filled 2021-03-11: qty 20

## 2021-03-11 MED ORDER — DEXAMETHASONE SODIUM PHOSPHATE 10 MG/ML IJ SOLN
INTRAMUSCULAR | Status: DC | PRN
  Administered 2021-03-11: 10 mg via INTRAVENOUS

## 2021-03-11 MED ORDER — PHENYLEPHRINE HCL-NACL 10-0.9 MG/250ML-% IV SOLN
INTRAVENOUS | Status: DC | PRN
  Administered 2021-03-11: 40 ug/min via INTRAVENOUS

## 2021-03-11 MED ORDER — ORAL CARE MOUTH RINSE: 15.0000 mL | Freq: Once | OROMUCOSAL | Status: DC

## 2021-03-11 MED ORDER — FENTANYL CITRATE (PF) 250 MCG/5ML IJ SOLN
INTRAMUSCULAR | Status: AC
  Filled 2021-03-11: qty 5

## 2021-03-11 MED ORDER — ROCURONIUM BROMIDE 10 MG/ML (PF) SYRINGE
PREFILLED_SYRINGE | INTRAVENOUS | Status: DC | PRN
  Administered 2021-03-11: 50 mg via INTRAVENOUS

## 2021-03-11 MED ORDER — PHENYLEPHRINE 40 MCG/ML (10ML) SYRINGE FOR IV PUSH (FOR BLOOD PRESSURE SUPPORT)
PREFILLED_SYRINGE | INTRAVENOUS | Status: AC
  Filled 2021-03-11: qty 10

## 2021-03-11 MED ORDER — LIDOCAINE 2% (20 MG/ML) 5 ML SYRINGE
INTRAMUSCULAR | Status: DC | PRN
  Administered 2021-03-11: 60 mg via INTRAVENOUS

## 2021-03-11 MED ORDER — ACETAMINOPHEN 10 MG/ML IV SOLN: 1000.0000 mg | Freq: Once | INTRAVENOUS | Status: DC | PRN

## 2021-03-11 MED ORDER — BUPIVACAINE LIPOSOME 1.3 % IJ SUSP
INTRAMUSCULAR | Status: DC | PRN
  Administered 2021-03-11: 20 mL

## 2021-03-11 MED ORDER — MIDAZOLAM HCL 5 MG/5ML IJ SOLN
INTRAMUSCULAR | Status: DC | PRN
  Administered 2021-03-11: 2 mg via INTRAVENOUS

## 2021-03-11 MED ORDER — BUPIVACAINE LIPOSOME 1.3 % IJ SUSP
INTRAMUSCULAR | Status: AC
  Filled 2021-03-11: qty 20

## 2021-03-11 MED ORDER — ROCURONIUM BROMIDE 10 MG/ML (PF) SYRINGE
PREFILLED_SYRINGE | INTRAVENOUS | Status: AC
  Filled 2021-03-11: qty 10

## 2021-03-11 MED ORDER — THROMBIN 20000 UNITS EX SOLR
CUTANEOUS | Status: AC
  Filled 2021-03-11: qty 20000

## 2021-03-11 MED ORDER — PROMETHAZINE HCL 25 MG/ML IJ SOLN: 6.2500 mg | INTRAMUSCULAR | Status: DC | PRN

## 2021-03-11 MED ORDER — PROPOFOL 10 MG/ML IV BOLUS
INTRAVENOUS | Status: DC | PRN
  Administered 2021-03-11: 130 mg via INTRAVENOUS
  Administered 2021-03-11: 20 mg via INTRAVENOUS

## 2021-03-11 MED ORDER — FENTANYL CITRATE (PF) 100 MCG/2ML IJ SOLN
INTRAMUSCULAR | Status: DC | PRN
  Administered 2021-03-11: 50 ug via INTRAVENOUS
  Administered 2021-03-11: 100 ug via INTRAVENOUS

## 2021-03-11 MED ORDER — EPHEDRINE SULFATE-NACL 50-0.9 MG/10ML-% IV SOSY
PREFILLED_SYRINGE | INTRAVENOUS | Status: DC | PRN
  Administered 2021-03-11: 10 mg via INTRAVENOUS

## 2021-03-11 MED ORDER — BUPIVACAINE-EPINEPHRINE (PF) 0.25% -1:200000 IJ SOLN
INTRAMUSCULAR | Status: AC
  Filled 2021-03-11: qty 30

## 2021-03-11 MED ORDER — MIDAZOLAM HCL 2 MG/2ML IJ SOLN
INTRAMUSCULAR | Status: AC
  Filled 2021-03-11: qty 2

## 2021-03-11 MED ORDER — LIDOCAINE 2% (20 MG/ML) 5 ML SYRINGE
INTRAMUSCULAR | Status: AC
  Filled 2021-03-11: qty 5

## 2021-03-11 MED ORDER — 0.9 % SODIUM CHLORIDE (POUR BTL) OPTIME
TOPICAL | Status: DC | PRN
  Administered 2021-03-11: 1000 mL

## 2021-03-11 MED ORDER — ONDANSETRON HCL 4 MG/2ML IJ SOLN
INTRAMUSCULAR | Status: AC
  Filled 2021-03-11: qty 2

## 2021-03-11 MED ORDER — ORAL CARE MOUTH RINSE: 15.0000 mL | Freq: Once | OROMUCOSAL | Status: AC

## 2021-03-11 SURGICAL SUPPLY — 52 items
BLADE CLIPPER SURG (BLADE) IMPLANT
BLADE SURG 11 STRL SS (BLADE) ×2 IMPLANT
CLSR STERI-STRIP ANTIMIC 1/2X4 (GAUZE/BANDAGES/DRESSINGS) ×2 IMPLANT
COVER SURGICAL LIGHT HANDLE (MISCELLANEOUS) ×2 IMPLANT
COVER WAND RF STERILE (DRAPES) ×2 IMPLANT
DRAPE C-ARM 42X72 X-RAY (DRAPES) ×2 IMPLANT
DRAPE C-ARMOR (DRAPES) ×2 IMPLANT
DRAPE SURG 17X23 STRL (DRAPES) ×2 IMPLANT
DRAPE U-SHAPE 47X51 STRL (DRAPES) ×2 IMPLANT
DRSG OPSITE 4X5.5 SM (GAUZE/BANDAGES/DRESSINGS) ×2 IMPLANT
DRSG OPSITE POSTOP 3X4 (GAUZE/BANDAGES/DRESSINGS) IMPLANT
DRSG OPSITE POSTOP 4X6 (GAUZE/BANDAGES/DRESSINGS) IMPLANT
DURAPREP 26ML APPLICATOR (WOUND CARE) ×2 IMPLANT
ELECT BLADE 4.0 EZ CLEAN MEGAD (MISCELLANEOUS) ×2
ELECT PENCIL ROCKER SW 15FT (MISCELLANEOUS) ×2 IMPLANT
ELECT REM PT RETURN 9FT ADLT (ELECTROSURGICAL) ×2
ELECTRODE BLDE 4.0 EZ CLN MEGD (MISCELLANEOUS) ×1 IMPLANT
ELECTRODE REM PT RTRN 9FT ADLT (ELECTROSURGICAL) ×1 IMPLANT
GLOVE BIOGEL PI IND STRL 8.5 (GLOVE) ×1 IMPLANT
GLOVE BIOGEL PI INDICATOR 8.5 (GLOVE) ×1
GLOVE SS BIOGEL STRL SZ 8.5 (GLOVE) ×1 IMPLANT
GLOVE SUPERSENSE BIOGEL SZ 8.5 (GLOVE) ×1
GOWN STRL REUS W/ TWL LRG LVL3 (GOWN DISPOSABLE) ×1 IMPLANT
GOWN STRL REUS W/TWL 2XL LVL3 (GOWN DISPOSABLE) ×2 IMPLANT
GOWN STRL REUS W/TWL LRG LVL3 (GOWN DISPOSABLE) ×1
GUIDE PIN IFUSE DISP 3.2 (PIN) ×6
IMPL IFUSE 3D 7X35 (Rod) ×2 IMPLANT
IMPL IFUSE 7.0X50 (Rod) ×1 IMPLANT
IMPLANT IFUSE 3D 7X35 (Rod) ×4 IMPLANT
IMPLANT IFUSE 7.0X50 (Rod) ×5 IMPLANT
KIT BASIN OR (CUSTOM PROCEDURE TRAY) ×2 IMPLANT
KIT TURNOVER KIT B (KITS) ×2 IMPLANT
NEEDLE 22X1 1/2 (OR ONLY) (NEEDLE) ×2 IMPLANT
NS IRRIG 1000ML POUR BTL (IV SOLUTION) ×2 IMPLANT
PACK LAMINECTOMY ORTHO (CUSTOM PROCEDURE TRAY) ×2 IMPLANT
PACK UNIVERSAL I (CUSTOM PROCEDURE TRAY) ×2 IMPLANT
PAD ARMBOARD 7.5X6 YLW CONV (MISCELLANEOUS) ×4 IMPLANT
PIN BLUNT IFUSE DISP 3.2 (PIN) ×2 IMPLANT
PIN EXCHANGE IFUSE DISP 3.2 (PIN) ×2 IMPLANT
PIN GUIDE IFUSE DISP 3.2 (PIN) ×3 IMPLANT
POSITIONER HEAD PRONE TRACH (MISCELLANEOUS) ×2 IMPLANT
STAPLER VISISTAT 35W (STAPLE) ×2 IMPLANT
SUT MNCRL AB 3-0 PS2 18 (SUTURE) IMPLANT
SUT VIC AB 1 CT1 18XCR BRD 8 (SUTURE) ×1 IMPLANT
SUT VIC AB 1 CT1 8-18 (SUTURE) ×1
SUT VIC AB 2-0 CT1 18 (SUTURE) ×2 IMPLANT
SYR BULB IRRIG 60ML STRL (SYRINGE) ×2 IMPLANT
SYR CONTROL 10ML LL (SYRINGE) ×2 IMPLANT
TOWEL GREEN STERILE (TOWEL DISPOSABLE) ×2 IMPLANT
TOWEL GREEN STERILE FF (TOWEL DISPOSABLE) ×2 IMPLANT
WATER STERILE IRR 1000ML POUR (IV SOLUTION) ×2 IMPLANT
YANKAUER SUCT BULB TIP NO VENT (SUCTIONS) ×2 IMPLANT

## 2021-03-11 NOTE — Transfer of Care (Signed)
Immediate Anesthesia Transfer of Care Note  Patient: Danielle Rice  Procedure(s) Performed: SACROILIAC JOINT FUSION (Right )  Patient Location: PACU  Anesthesia Type:General  Level of Consciousness: oriented, drowsy and patient cooperative  Airway & Oxygen Therapy: Patient Spontanous Breathing and Patient connected to nasal cannula oxygen  Post-op Assessment: Report given to RN and Post -op Vital signs reviewed and stable  Post vital signs: Reviewed  Last Vitals:  Vitals Value Taken Time  BP 107/78 03/11/21 1228  Temp    Pulse 91 03/11/21 1229  Resp 12 03/11/21 1229  SpO2 100 % 03/11/21 1229  Vitals shown include unvalidated device data.  Last Pain:  Vitals:   03/11/21 0937  TempSrc:   PainSc: 6          Complications: No complications documented.

## 2021-03-11 NOTE — Anesthesia Postprocedure Evaluation (Signed)
Anesthesia Post Note  Patient: Danielle Rice  Procedure(s) Performed: SACROILIAC JOINT FUSION (Right )     Patient location during evaluation: PACU Anesthesia Type: General Level of consciousness: awake and alert Pain management: pain level controlled Vital Signs Assessment: post-procedure vital signs reviewed and stable Respiratory status: spontaneous breathing, nonlabored ventilation, respiratory function stable and patient connected to nasal cannula oxygen Cardiovascular status: blood pressure returned to baseline and stable Postop Assessment: no apparent nausea or vomiting Anesthetic complications: no   No complications documented.  Last Vitals:  Vitals:   03/11/21 1243 03/11/21 1258  BP: 115/72 109/73  Pulse: 88 83  Resp: 20 18  Temp:  (!) 36.2 C  SpO2: 96% 98%    Last Pain:  Vitals:   03/11/21 1258  TempSrc:   PainSc: 3                  [name]

## 2021-03-11 NOTE — Anesthesia Preprocedure Evaluation (Addendum)
Anesthesia Evaluation  Patient identified by MRN, date of birth, ID band Patient awake    Reviewed: Allergy & Precautions, NPO status , Patient's Chart, lab work & pertinent test results  Airway Mallampati: II  TM Distance: >3 FB Neck ROM: Full    Dental  (+) Teeth Intact, Dental Advisory Given   Pulmonary neg pulmonary ROS,    Pulmonary exam normal        Cardiovascular negative cardio ROS   Rhythm:Regular Rate:Normal     Neuro/Psych Anxiety Depression negative neurological ROS     GI/Hepatic negative GI ROS, Neg liver ROS,   Endo/Other  negative endocrine ROS  Renal/GU negative Renal ROS  negative genitourinary   Musculoskeletal  (+) Arthritis , Osteoarthritis,    Abdominal (+)  Abdomen: soft. Bowel sounds: normal.  Peds  Hematology  (+) anemia ,   Anesthesia Other Findings   Reproductive/Obstetrics                            Anesthesia Physical Anesthesia Plan  ASA: II  Anesthesia Plan: General   Post-op Pain Management:    Induction: Intravenous  PONV Risk Score and Plan: 3 and Ondansetron, Dexamethasone and Treatment may vary due to age or medical condition  Airway Management Planned: Mask and Oral ETT  Additional Equipment: None  Intra-op Plan:   Post-operative Plan: Extubation in OR  Informed Consent: I have reviewed the patients History and Physical, chart, labs and discussed the procedure including the risks, benefits and alternatives for the proposed anesthesia with the patient or authorized representative who has indicated his/her understanding and acceptance.     Dental advisory given  Plan Discussed with: CRNA  Anesthesia Plan Comments: (Lab Results      Component                Value               Date                      WBC                      5.8                 03/04/2021                HGB                      12.8                03/04/2021                 HCT                      38.3                03/04/2021                MCV                      94.6                03/04/2021                PLT                      239  03/04/2021          )        Anesthesia Quick Evaluation

## 2021-03-11 NOTE — Discharge Instructions (Signed)
SI Fusion, Care After This sheet gives you information about how to care for yourself after your procedure. Your health care provider may also give you more specific instructions. If you have problems or questions, contact your health care provider.  What can I expect after the procedure? After the procedure, it is common to have: Some pain around your incision area. Muscle tightening (spasms) across the back.   Follow these instructions at home: Incision care Follow instructions from your health care provider about how to take care of your incision area. Make sure you: Wash your hands with soap and water before and after you apply medicine to the area or change your bandage (dressing). If soap and water are not available, use hand sanitizer. Change your dressing as told by your health care provider. Leave stitches (sutures), skin glue, or adhesive strips in place. These skin closures may need to stay in place for 2 weeks or longer. If adhesive strip edges start to loosen and curl up, you may trim the loose edges/apply tape. Do not remove adhesive strips completely unless your health care provider tells you to do that. Do not cut suture knots.  Check your incision area every day for signs of infection. Notify your health care provider if you see any of the following. Check for: More redness, swelling, or pain. More fluid or blood. Warmth. Pus or a bad smell. Fever >100.4 degrees F. Medicines Take over-the-counter and prescription medicines only as told by your health care provider. If you were prescribed an antibiotic medicine, use it as told by your health care provider. Do not stop using the antibiotic even if you start to feel better. Pain medications have been electronically prescribed. Notify your pharmacy or health care provider when/if you need a refill.  Bathing Do not take baths, swim, or use a hot tub for 6 weeks, or until your incision has healed completely & you are told it  is OK by your health care provider. Ok to shower in five (5) days after surgery. Let water run over incision. Do not scrub or point shower head directly at incision. Do not submerge the incision  Activity Return to your normal activities as told by your health care provider. Ask your health care provider what activities are safe for you. Avoid bending or twisting at your waist. Always bend at your knees. Do not sit for more than 20-30 minutes at a time. Lie down or walk between periods of sitting. Do not lift anything that is heavier than 10 lb (4.5 kg) or the limit that your health care provider tells you, until he or she says that it is safe. Do not drive for 2 weeks after your procedure or for as long as your health care provider tells you.  Do not drive or use heavy machinery while taking prescription pain medicine. DO NOT BEAR WEIGHT ON THE SURGICAL LEG  Crutch Mobility: Non-Weight Bearing    Keep weight off surgical leg. Grasp crutch handles securely. Do NOT lean armpits on crutches. 1.Move both crutches forward and slightly out to sides. 2.Hop up to crutches with non-operative leg. Repeat above sequence for each step taken.  General instructions To prevent or treat constipation while you are taking prescription pain medicine, your health care provider may recommend that you: Drink enough fluid to keep your urine clear or pale yellow. Take over-the-counter or prescription medicines. Eat foods that are high in fiber, such as fresh fruits and vegetables, whole grains, and beans. Limit foods   that are high in fat and processed sugars, such as fried and sweet foods. Keep all follow-up visits as told by your health care provider. This is important.  Contact a health care provider if: You have more redness, swelling, or pain around your incision area. Your incision feels warm to the touch. You have leg swelling, discoloration, tenderness, or pain You have a productive cough You have  a fever >100.4 degrees F You are not able to return to activities or do exercises as told by your health care provider.  Get help right away if: You have: More fluid or blood coming from your incision area. Pus or a bad smell coming from your incision area. Chills or a fever. Episodes of dizziness or fainting while standing. You develop a rash. You develop shortness of breath or you have difficulty breathing. You cannot control when you urinate or have a bowel movement. You become weak. You are not able to use your legs.  Summary After the procedure, it is common to have some pain around your incision area. You may also have muscle tightening (spasms) across the back. Follow instructions from your health care provider about how to care for your incision. Do not lift anything that is heavier than 10 lb (4.5 kg) or the limit that your health care provider tells you, until he or she says that it is safe. Contact your health care provider if you have more redness, swelling, or pain around your incision area or if your incision feels warm to the touch. These can be signs of infection. This information is not intended to replace advice given to you by your health care provider. Make sure you discuss any questions you have with your health care provider.  

## 2021-03-11 NOTE — Anesthesia Procedure Notes (Signed)
Procedure Name: Intubation Date/Time: 03/11/2021 11:01 AM Performed by: Lovie Chol, CRNA Pre-anesthesia Checklist: Patient identified, Emergency Drugs available, Suction available and Patient being monitored Patient Re-evaluated:Patient Re-evaluated prior to induction Oxygen Delivery Method: Circle System Utilized Preoxygenation: Pre-oxygenation with 100% oxygen Induction Type: IV induction Ventilation: Mask ventilation without difficulty Laryngoscope Size: Miller and 2 Grade View: Grade I Tube type: Oral Tube size: 7.0 mm Number of attempts: 1 Airway Equipment and Method: Stylet and Oral airway Placement Confirmation: ETT inserted through vocal cords under direct vision,  positive ETCO2 and breath sounds checked- equal and bilateral Secured at: 22 cm Tube secured with: Tape Dental Injury: Teeth and Oropharynx as per pre-operative assessment

## 2021-03-11 NOTE — Brief Op Note (Signed)
03/11/2021  12:11 PM  PATIENT:  Danielle Rice  50 y.o. female  PRE-OPERATIVE DIAGNOSIS:  Right sacroiliac joint pain  POST-OPERATIVE DIAGNOSIS:  Right sacroiliac joint pain  PROCEDURE:  Procedure(s) with comments: SACROILIAC JOINT FUSION (Right) - 90 mins  SURGEON:  Surgeon(s) and Role:    Venita Lick, MD - Primary  PHYSICIAN ASSISTANT: Voncille Lo, PA  ANESTHESIA:   general  EBL:  5 mL   BLOOD ADMINISTERED:none  DRAINS: none   LOCAL MEDICATIONS USED:  MARCAINE    and OTHER Exparel  SPECIMEN:  No Specimen  DISPOSITION OF SPECIMEN:  N/A  COUNTS:  YES  TOURNIQUET:  * No tourniquets in log *  DICTATION: .Dragon Dictation  PLAN OF CARE: Discharge to home after PACU  PATIENT DISPOSITION:  PACU - hemodynamically stable.

## 2021-03-11 NOTE — Op Note (Signed)
OPERATIVE REPORT  DATE OF SURGERY: 03/11/2021  PATIENT NAME:  Danielle Rice MRN: [id number] DOB: 05-30-1971  PCP: Henrine Screws, MD  PRE-OPERATIVE DIAGNOSIS: Right sacroiliac dysfunction  POST-OPERATIVE DIAGNOSIS: Same  PROCEDURE:   Right sacroiliac fusion  SURGEON:  Venita Lick, MD  PHYSICIAN ASSISTANT: Voncille Lo, PA  ANESTHESIA:   General  EBL: 5  ml   Implants: I fuse 3D SI fusion device.  Top: 7.0 x 50 mm.  Middle: 7.0 x 35.  Bottom: 7.0 x 35.  BRIEF HISTORY: Danielle Rice is a 50 y.o. female who presents with significant right sacroiliac joint pain.  Patient has had temporary significant improvement with SI joint injections and physical therapy but her overall quality of life remains poor.  Because of the temporary improvement conservative management she elected to move forward with surgery for more permanent pain relief.  All appropriate risks, benefits, alternatives to surgery were discussed and consent was obtained  PROCEDURE DETAILS: Patient was brought into the operating room. After successful induction of general anesthesia and endotracheal intubation a Time Out was done. This confirmed all pertinent important data.  Teds and SCDs were applied and she was turned prone onto her chest and pelvic roll.  Patient was secured to the table with tape and the right gluteal region was prepped and draped in standard fashion.  Using fluoroscopy I marked out the anatomical borders of the sacroiliac joint.  I then placed the pin percutaneously down to the lateral aspect of the iliac wing.  I confirmed satisfactory position in the lateral plane and advanced the pin into the iliac wing.  As I neared the sacroiliac joint I switched to the inlet view to confirm satisfactory trajectory.  Once I confirm this I advanced across the sacroiliac joint into the sacrum.  I then checked the outlet view to confirm that I was not nearing the foramen.  Once I was pleased with the overall  position of this for screw in all 3 planes I then used the targeting device to place the second and ultimately the third pin using the same fluoroscopy guidance technique.  At this point all 3 pins were properly placed.  I then made a single incision linking all 3 pins and put broach over the first guidepin.  I then measured and then advanced approach over the guidepin through the iliac wing and just beyond the SI joint.  I removed the broach and then inserted the 15 mm I fuse device.  Once I confirmed satisfactory position in the outlet and lateral view I then reposition the working trocar at middle pin approached and placed the I fuse device.  I repeated this exact same technique at the lower one.  Once all 3 devices were in place I then took my final inlet, outlet, and lateral view.  I was very pleased with the overall position of the hardware.  At this point the wound was copiously irrigated with normal saline and closed in a layered fashion with interrupted #1 Vicryl suture, 2-0 Vicryl suture, and 3-0 Monocryl.  I made sure to add additional postoperative analgesia with Marcaine and Exparel mix.  Sterile dressing was applied and the patient was ultimately extubated transfer the PACU without incident.  At the end of the case all needle and sponge counts were correct.  Venita Lick, MD 03/11/2021 12:01 PM

## 2021-03-11 NOTE — H&P (Signed)
Addendum: Patient presents today with ongoing significant right sacroiliac joint pain. despite appropriate conservative management her quality of life is continued to deteriorate.  The patient had a successful diagnostic SI injection as well as therapeutic injection.  After confirming the pain generators and the failure of conservative measures to maintain a positive quality of life we have elected to move forward with surgery.  All appropriate risks, benefits, alternatives to surgery were discussed with the patient and consent was obtained.  All of her questions were encouraged and addressed.  There is been no change in her clinical exam since her last office visit of 03/08/2009

## 2021-03-12 ENCOUNTER — Encounter (HOSPITAL_COMMUNITY): Payer: Self-pay | Admitting: Orthopedic Surgery

## 2021-03-17 ENCOUNTER — Encounter (HOSPITAL_COMMUNITY): Payer: Self-pay | Admitting: Orthopedic Surgery

## 2021-04-26 DIAGNOSIS — Z4889 Encounter for other specified surgical aftercare: Secondary | ICD-10-CM | POA: Diagnosis not present

## 2021-05-07 DIAGNOSIS — Z4889 Encounter for other specified surgical aftercare: Secondary | ICD-10-CM | POA: Diagnosis not present

## 2021-05-07 DIAGNOSIS — M6281 Muscle weakness (generalized): Secondary | ICD-10-CM | POA: Diagnosis not present

## 2021-05-07 DIAGNOSIS — M25551 Pain in right hip: Secondary | ICD-10-CM | POA: Diagnosis not present

## 2021-05-11 DIAGNOSIS — M25551 Pain in right hip: Secondary | ICD-10-CM | POA: Diagnosis not present

## 2021-05-11 DIAGNOSIS — M6281 Muscle weakness (generalized): Secondary | ICD-10-CM | POA: Diagnosis not present

## 2021-05-11 DIAGNOSIS — Z4889 Encounter for other specified surgical aftercare: Secondary | ICD-10-CM | POA: Diagnosis not present

## 2021-05-17 DIAGNOSIS — M25551 Pain in right hip: Secondary | ICD-10-CM | POA: Diagnosis not present

## 2021-05-17 DIAGNOSIS — Z4889 Encounter for other specified surgical aftercare: Secondary | ICD-10-CM | POA: Diagnosis not present

## 2021-05-17 DIAGNOSIS — M6281 Muscle weakness (generalized): Secondary | ICD-10-CM | POA: Diagnosis not present

## 2021-05-19 DIAGNOSIS — M25551 Pain in right hip: Secondary | ICD-10-CM | POA: Diagnosis not present

## 2021-05-19 DIAGNOSIS — Z4889 Encounter for other specified surgical aftercare: Secondary | ICD-10-CM | POA: Diagnosis not present

## 2021-05-19 DIAGNOSIS — M6281 Muscle weakness (generalized): Secondary | ICD-10-CM | POA: Diagnosis not present

## 2021-05-26 DIAGNOSIS — M6281 Muscle weakness (generalized): Secondary | ICD-10-CM | POA: Diagnosis not present

## 2021-05-26 DIAGNOSIS — Z4889 Encounter for other specified surgical aftercare: Secondary | ICD-10-CM | POA: Diagnosis not present

## 2021-05-26 DIAGNOSIS — M25551 Pain in right hip: Secondary | ICD-10-CM | POA: Diagnosis not present

## 2021-06-01 DIAGNOSIS — Z4889 Encounter for other specified surgical aftercare: Secondary | ICD-10-CM | POA: Diagnosis not present

## 2021-06-01 DIAGNOSIS — M6281 Muscle weakness (generalized): Secondary | ICD-10-CM | POA: Diagnosis not present

## 2021-06-01 DIAGNOSIS — M25551 Pain in right hip: Secondary | ICD-10-CM | POA: Diagnosis not present

## 2021-06-07 DIAGNOSIS — Z4889 Encounter for other specified surgical aftercare: Secondary | ICD-10-CM | POA: Diagnosis not present

## 2021-06-08 DIAGNOSIS — M25551 Pain in right hip: Secondary | ICD-10-CM | POA: Diagnosis not present

## 2021-06-08 DIAGNOSIS — M6281 Muscle weakness (generalized): Secondary | ICD-10-CM | POA: Diagnosis not present

## 2021-06-08 DIAGNOSIS — Z4889 Encounter for other specified surgical aftercare: Secondary | ICD-10-CM | POA: Diagnosis not present

## 2021-06-15 DIAGNOSIS — M6281 Muscle weakness (generalized): Secondary | ICD-10-CM | POA: Diagnosis not present

## 2021-06-15 DIAGNOSIS — Z4889 Encounter for other specified surgical aftercare: Secondary | ICD-10-CM | POA: Diagnosis not present

## 2021-06-15 DIAGNOSIS — M25551 Pain in right hip: Secondary | ICD-10-CM | POA: Diagnosis not present

## 2021-06-17 DIAGNOSIS — Z4889 Encounter for other specified surgical aftercare: Secondary | ICD-10-CM | POA: Diagnosis not present

## 2021-06-17 DIAGNOSIS — M25551 Pain in right hip: Secondary | ICD-10-CM | POA: Diagnosis not present

## 2021-06-17 DIAGNOSIS — M6281 Muscle weakness (generalized): Secondary | ICD-10-CM | POA: Diagnosis not present

## 2021-06-22 DIAGNOSIS — M25551 Pain in right hip: Secondary | ICD-10-CM | POA: Diagnosis not present

## 2021-06-22 DIAGNOSIS — M6281 Muscle weakness (generalized): Secondary | ICD-10-CM | POA: Diagnosis not present

## 2021-06-22 DIAGNOSIS — Z4889 Encounter for other specified surgical aftercare: Secondary | ICD-10-CM | POA: Diagnosis not present

## 2021-06-24 DIAGNOSIS — M6281 Muscle weakness (generalized): Secondary | ICD-10-CM | POA: Diagnosis not present

## 2021-06-24 DIAGNOSIS — M25551 Pain in right hip: Secondary | ICD-10-CM | POA: Diagnosis not present

## 2021-06-24 DIAGNOSIS — Z4889 Encounter for other specified surgical aftercare: Secondary | ICD-10-CM | POA: Diagnosis not present

## 2021-06-29 DIAGNOSIS — M25551 Pain in right hip: Secondary | ICD-10-CM | POA: Diagnosis not present

## 2021-06-29 DIAGNOSIS — M6281 Muscle weakness (generalized): Secondary | ICD-10-CM | POA: Diagnosis not present

## 2021-06-29 DIAGNOSIS — Z4889 Encounter for other specified surgical aftercare: Secondary | ICD-10-CM | POA: Diagnosis not present

## 2021-07-01 DIAGNOSIS — M25551 Pain in right hip: Secondary | ICD-10-CM | POA: Diagnosis not present

## 2021-07-01 DIAGNOSIS — Z4889 Encounter for other specified surgical aftercare: Secondary | ICD-10-CM | POA: Diagnosis not present

## 2021-07-01 DIAGNOSIS — M6281 Muscle weakness (generalized): Secondary | ICD-10-CM | POA: Diagnosis not present

## 2021-07-06 DIAGNOSIS — M6281 Muscle weakness (generalized): Secondary | ICD-10-CM | POA: Diagnosis not present

## 2021-07-06 DIAGNOSIS — Z4889 Encounter for other specified surgical aftercare: Secondary | ICD-10-CM | POA: Diagnosis not present

## 2021-07-06 DIAGNOSIS — M25551 Pain in right hip: Secondary | ICD-10-CM | POA: Diagnosis not present

## 2021-07-08 DIAGNOSIS — Z4889 Encounter for other specified surgical aftercare: Secondary | ICD-10-CM | POA: Diagnosis not present

## 2021-07-08 DIAGNOSIS — M25551 Pain in right hip: Secondary | ICD-10-CM | POA: Diagnosis not present

## 2021-07-08 DIAGNOSIS — M6281 Muscle weakness (generalized): Secondary | ICD-10-CM | POA: Diagnosis not present

## 2021-07-13 DIAGNOSIS — M25551 Pain in right hip: Secondary | ICD-10-CM | POA: Diagnosis not present

## 2021-07-13 DIAGNOSIS — M6281 Muscle weakness (generalized): Secondary | ICD-10-CM | POA: Diagnosis not present

## 2021-07-13 DIAGNOSIS — Z4889 Encounter for other specified surgical aftercare: Secondary | ICD-10-CM | POA: Diagnosis not present

## 2021-07-15 DIAGNOSIS — Z4889 Encounter for other specified surgical aftercare: Secondary | ICD-10-CM | POA: Diagnosis not present

## 2021-07-15 DIAGNOSIS — M6281 Muscle weakness (generalized): Secondary | ICD-10-CM | POA: Diagnosis not present

## 2021-07-15 DIAGNOSIS — M25551 Pain in right hip: Secondary | ICD-10-CM | POA: Diagnosis not present

## 2021-07-20 DIAGNOSIS — M6281 Muscle weakness (generalized): Secondary | ICD-10-CM | POA: Diagnosis not present

## 2021-07-20 DIAGNOSIS — Z4889 Encounter for other specified surgical aftercare: Secondary | ICD-10-CM | POA: Diagnosis not present

## 2021-07-20 DIAGNOSIS — M25551 Pain in right hip: Secondary | ICD-10-CM | POA: Diagnosis not present

## 2021-07-22 DIAGNOSIS — Z4889 Encounter for other specified surgical aftercare: Secondary | ICD-10-CM | POA: Diagnosis not present

## 2021-07-22 DIAGNOSIS — M25551 Pain in right hip: Secondary | ICD-10-CM | POA: Diagnosis not present

## 2021-07-22 DIAGNOSIS — M6281 Muscle weakness (generalized): Secondary | ICD-10-CM | POA: Diagnosis not present

## 2021-07-23 DIAGNOSIS — Z4889 Encounter for other specified surgical aftercare: Secondary | ICD-10-CM | POA: Diagnosis not present

## 2021-07-27 DIAGNOSIS — M25551 Pain in right hip: Secondary | ICD-10-CM | POA: Diagnosis not present

## 2021-07-27 DIAGNOSIS — Z4889 Encounter for other specified surgical aftercare: Secondary | ICD-10-CM | POA: Diagnosis not present

## 2021-07-27 DIAGNOSIS — M6281 Muscle weakness (generalized): Secondary | ICD-10-CM | POA: Diagnosis not present

## 2021-07-29 DIAGNOSIS — Z4889 Encounter for other specified surgical aftercare: Secondary | ICD-10-CM | POA: Diagnosis not present

## 2021-07-29 DIAGNOSIS — M6281 Muscle weakness (generalized): Secondary | ICD-10-CM | POA: Diagnosis not present

## 2021-07-29 DIAGNOSIS — M25551 Pain in right hip: Secondary | ICD-10-CM | POA: Diagnosis not present

## 2021-08-03 DIAGNOSIS — M6281 Muscle weakness (generalized): Secondary | ICD-10-CM | POA: Diagnosis not present

## 2021-08-03 DIAGNOSIS — Z4889 Encounter for other specified surgical aftercare: Secondary | ICD-10-CM | POA: Diagnosis not present

## 2021-08-03 DIAGNOSIS — M25551 Pain in right hip: Secondary | ICD-10-CM | POA: Diagnosis not present

## 2021-08-12 DIAGNOSIS — Z01419 Encounter for gynecological examination (general) (routine) without abnormal findings: Secondary | ICD-10-CM | POA: Diagnosis not present

## 2021-08-17 DIAGNOSIS — M6281 Muscle weakness (generalized): Secondary | ICD-10-CM | POA: Diagnosis not present

## 2021-08-17 DIAGNOSIS — Z4889 Encounter for other specified surgical aftercare: Secondary | ICD-10-CM | POA: Diagnosis not present

## 2021-08-17 DIAGNOSIS — M25551 Pain in right hip: Secondary | ICD-10-CM | POA: Diagnosis not present

## 2021-09-14 DIAGNOSIS — M5416 Radiculopathy, lumbar region: Secondary | ICD-10-CM | POA: Diagnosis not present

## 2021-09-14 DIAGNOSIS — M545 Low back pain, unspecified: Secondary | ICD-10-CM | POA: Diagnosis not present

## 2021-09-15 DIAGNOSIS — M6281 Muscle weakness (generalized): Secondary | ICD-10-CM | POA: Diagnosis not present

## 2021-09-15 DIAGNOSIS — M25551 Pain in right hip: Secondary | ICD-10-CM | POA: Diagnosis not present

## 2021-09-15 DIAGNOSIS — Z4889 Encounter for other specified surgical aftercare: Secondary | ICD-10-CM | POA: Diagnosis not present

## 2021-09-23 DIAGNOSIS — M545 Low back pain, unspecified: Secondary | ICD-10-CM | POA: Diagnosis not present

## 2021-09-23 DIAGNOSIS — Z4889 Encounter for other specified surgical aftercare: Secondary | ICD-10-CM | POA: Diagnosis not present

## 2021-09-23 DIAGNOSIS — M25551 Pain in right hip: Secondary | ICD-10-CM | POA: Diagnosis not present

## 2021-09-23 DIAGNOSIS — M6281 Muscle weakness (generalized): Secondary | ICD-10-CM | POA: Diagnosis not present

## 2021-09-24 DIAGNOSIS — F411 Generalized anxiety disorder: Secondary | ICD-10-CM | POA: Diagnosis not present

## 2021-09-24 DIAGNOSIS — Z13220 Encounter for screening for lipoid disorders: Secondary | ICD-10-CM | POA: Diagnosis not present

## 2021-10-01 DIAGNOSIS — M5416 Radiculopathy, lumbar region: Secondary | ICD-10-CM | POA: Diagnosis not present

## 2021-10-07 DIAGNOSIS — M25551 Pain in right hip: Secondary | ICD-10-CM | POA: Diagnosis not present

## 2021-10-07 DIAGNOSIS — M545 Low back pain, unspecified: Secondary | ICD-10-CM | POA: Diagnosis not present

## 2021-10-07 DIAGNOSIS — M6281 Muscle weakness (generalized): Secondary | ICD-10-CM | POA: Diagnosis not present

## 2021-10-07 DIAGNOSIS — Z4889 Encounter for other specified surgical aftercare: Secondary | ICD-10-CM | POA: Diagnosis not present

## 2021-10-29 DIAGNOSIS — M5416 Radiculopathy, lumbar region: Secondary | ICD-10-CM | POA: Diagnosis not present

## 2021-11-09 DIAGNOSIS — M6281 Muscle weakness (generalized): Secondary | ICD-10-CM | POA: Diagnosis not present

## 2021-11-09 DIAGNOSIS — Z4889 Encounter for other specified surgical aftercare: Secondary | ICD-10-CM | POA: Diagnosis not present

## 2021-11-09 DIAGNOSIS — M545 Low back pain, unspecified: Secondary | ICD-10-CM | POA: Diagnosis not present

## 2021-11-09 DIAGNOSIS — M25551 Pain in right hip: Secondary | ICD-10-CM | POA: Diagnosis not present

## 2021-11-11 DIAGNOSIS — M5416 Radiculopathy, lumbar region: Secondary | ICD-10-CM | POA: Diagnosis not present

## 2021-11-29 DIAGNOSIS — E785 Hyperlipidemia, unspecified: Secondary | ICD-10-CM | POA: Diagnosis not present

## 2021-11-29 DIAGNOSIS — F411 Generalized anxiety disorder: Secondary | ICD-10-CM | POA: Diagnosis not present

## 2021-12-02 DIAGNOSIS — M5416 Radiculopathy, lumbar region: Secondary | ICD-10-CM | POA: Diagnosis not present

## 2022-01-26 DIAGNOSIS — L03211 Cellulitis of face: Secondary | ICD-10-CM | POA: Diagnosis not present

## 2022-01-26 DIAGNOSIS — L539 Erythematous condition, unspecified: Secondary | ICD-10-CM | POA: Diagnosis not present

## 2022-01-26 DIAGNOSIS — R220 Localized swelling, mass and lump, head: Secondary | ICD-10-CM | POA: Diagnosis not present

## 2022-01-26 DIAGNOSIS — R202 Paresthesia of skin: Secondary | ICD-10-CM | POA: Diagnosis not present

## 2022-01-26 DIAGNOSIS — R519 Headache, unspecified: Secondary | ICD-10-CM | POA: Diagnosis not present

## 2022-02-08 DIAGNOSIS — M5416 Radiculopathy, lumbar region: Secondary | ICD-10-CM | POA: Diagnosis not present

## 2022-02-10 DIAGNOSIS — Z1231 Encounter for screening mammogram for malignant neoplasm of breast: Secondary | ICD-10-CM | POA: Diagnosis not present

## 2022-02-21 DIAGNOSIS — M5416 Radiculopathy, lumbar region: Secondary | ICD-10-CM | POA: Diagnosis not present

## 2022-03-07 DIAGNOSIS — G5701 Lesion of sciatic nerve, right lower limb: Secondary | ICD-10-CM | POA: Diagnosis not present

## 2022-04-05 DIAGNOSIS — G5701 Lesion of sciatic nerve, right lower limb: Secondary | ICD-10-CM | POA: Diagnosis not present

## 2022-04-20 DIAGNOSIS — G5701 Lesion of sciatic nerve, right lower limb: Secondary | ICD-10-CM | POA: Diagnosis not present

## 2022-05-31 DIAGNOSIS — Z6825 Body mass index (BMI) 25.0-25.9, adult: Secondary | ICD-10-CM | POA: Diagnosis not present

## 2022-05-31 DIAGNOSIS — H60331 Swimmer's ear, right ear: Secondary | ICD-10-CM | POA: Diagnosis not present

## 2022-06-15 DIAGNOSIS — E785 Hyperlipidemia, unspecified: Secondary | ICD-10-CM | POA: Diagnosis not present

## 2022-06-15 DIAGNOSIS — F411 Generalized anxiety disorder: Secondary | ICD-10-CM | POA: Diagnosis not present

## 2022-06-15 DIAGNOSIS — F321 Major depressive disorder, single episode, moderate: Secondary | ICD-10-CM | POA: Diagnosis not present

## 2022-09-19 DIAGNOSIS — J0120 Acute ethmoidal sinusitis, unspecified: Secondary | ICD-10-CM | POA: Diagnosis not present

## 2022-09-19 DIAGNOSIS — Z6825 Body mass index (BMI) 25.0-25.9, adult: Secondary | ICD-10-CM | POA: Diagnosis not present
